# Patient Record
Sex: Male | Born: 1990 | Race: White | Hispanic: No | Marital: Married | Smoking: Former smoker
Health system: Southern US, Community
[De-identification: ages and names within clinical notes are randomized; demographics above are authoritative.]

---
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
## 2011-07-23 IMAGING — US ABDOMEN ULTRASOUND
1 series · 17 of 25 positions shown · non-contrast
Comparison: none

REASON FOR EXAM: Upper abd pain with elevated AST and bilirubin
COMMENTS:

[Series 1: abdomen ultrasound · 17 of 54 slices shown]
[im 1/54]
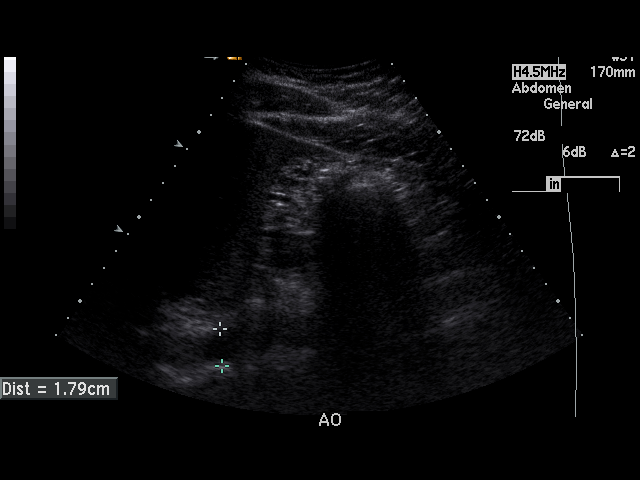
[im 5/54]
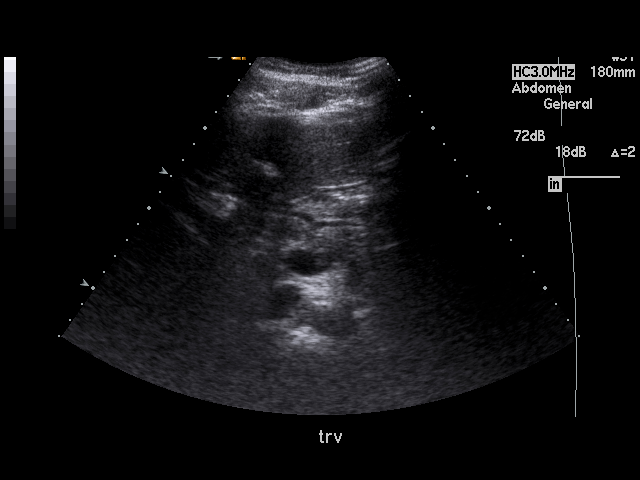
[im 7/54]
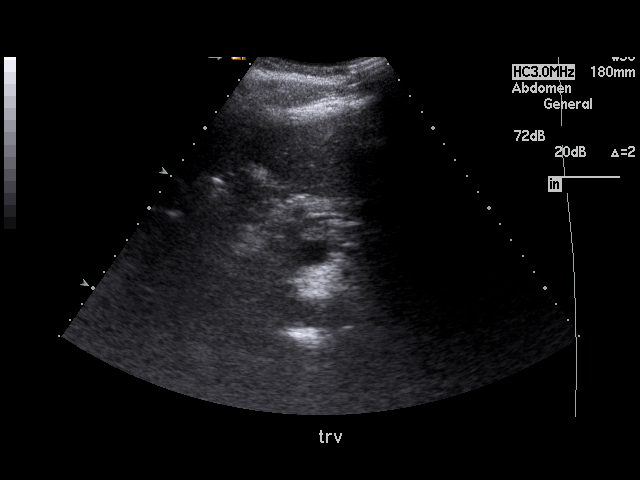
[im 12/54]
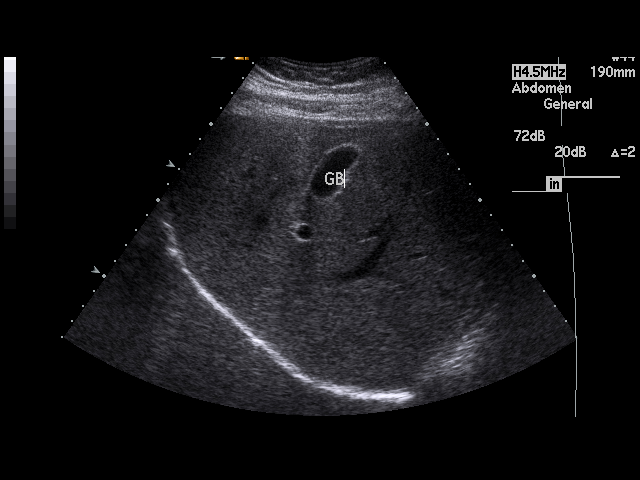
[im 14/54]
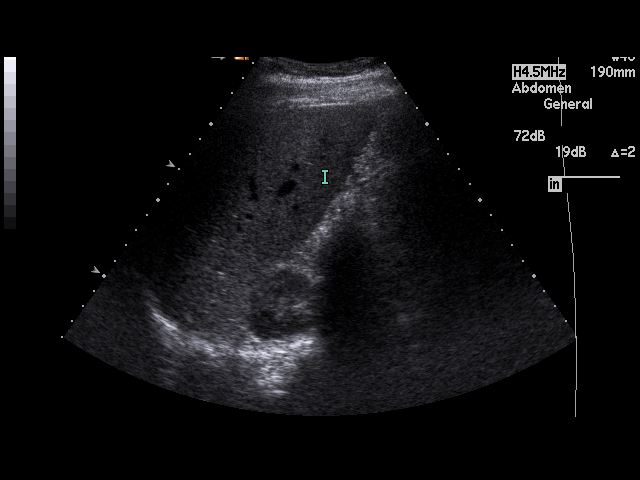
[im 18/54]
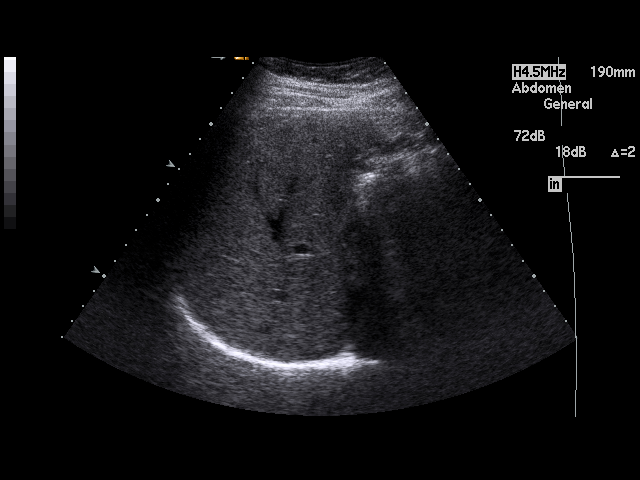
[im 20/54]
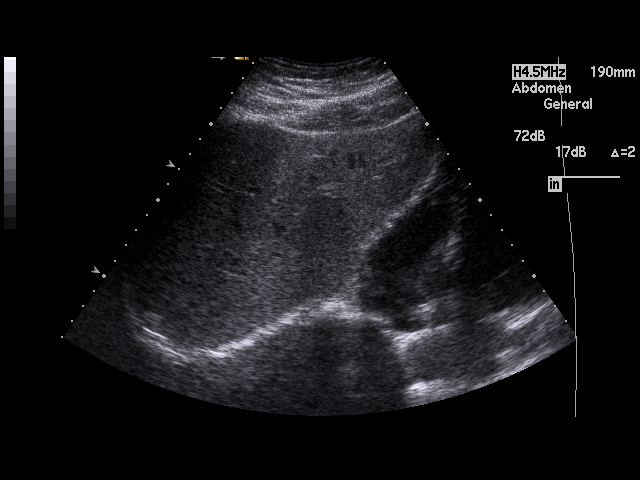
[im 25/54]
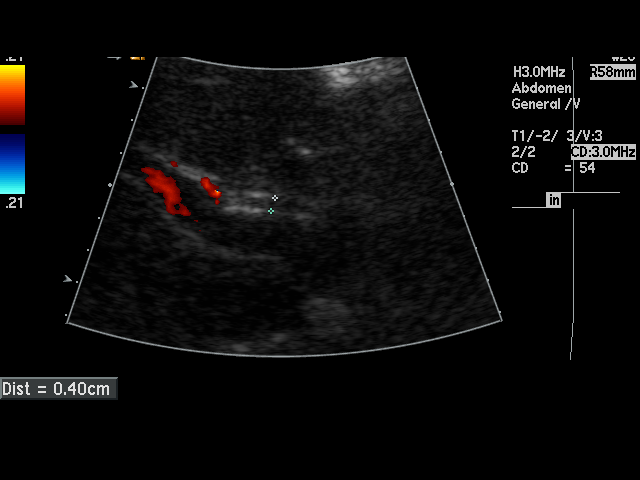
[im 27/54]
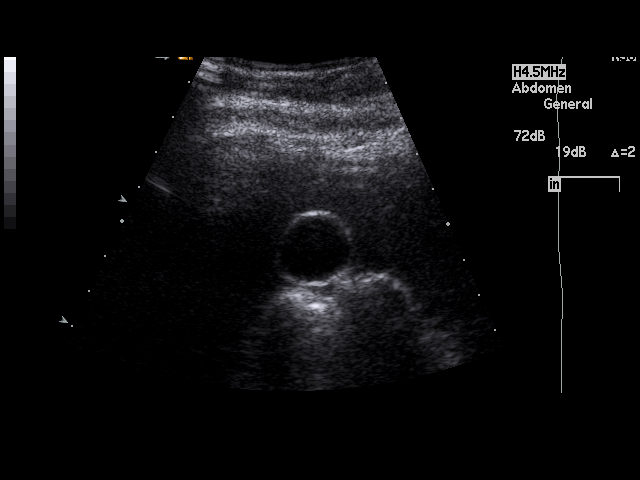
[im 29/54]
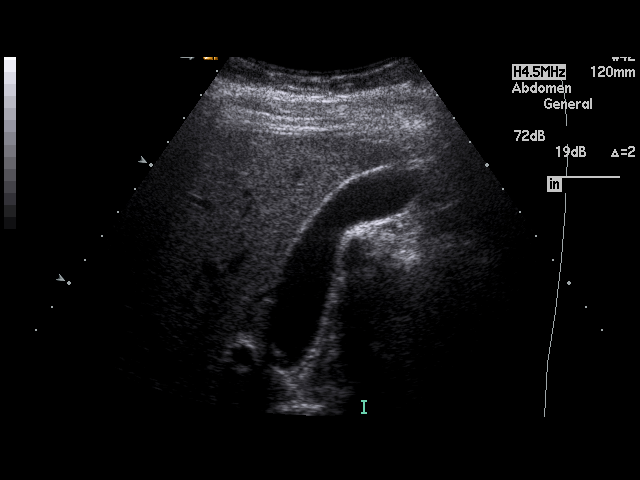
[im 34/54]
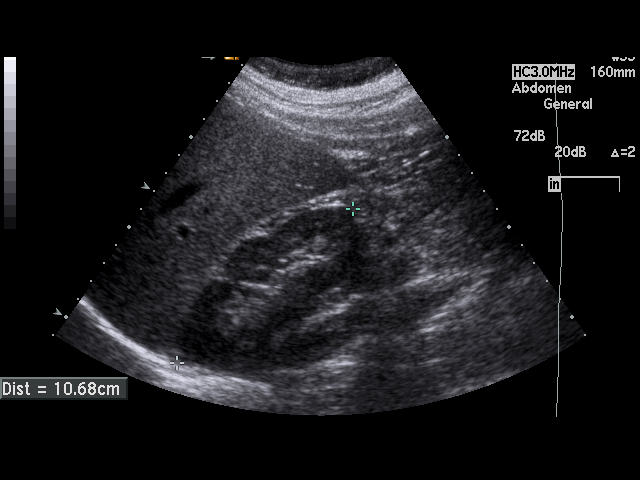
[im 36/54]
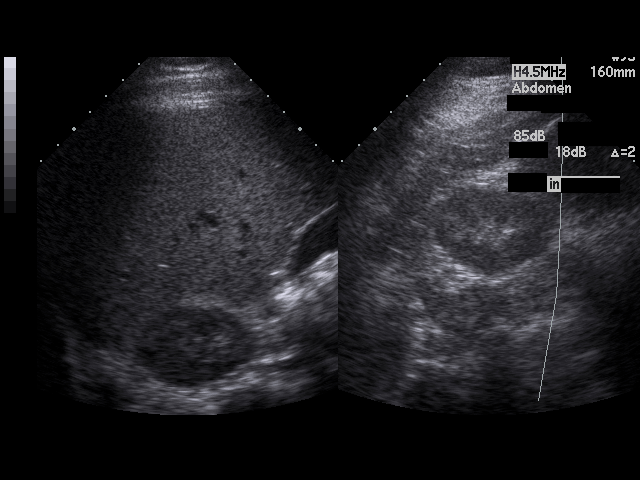
[im 40/54]
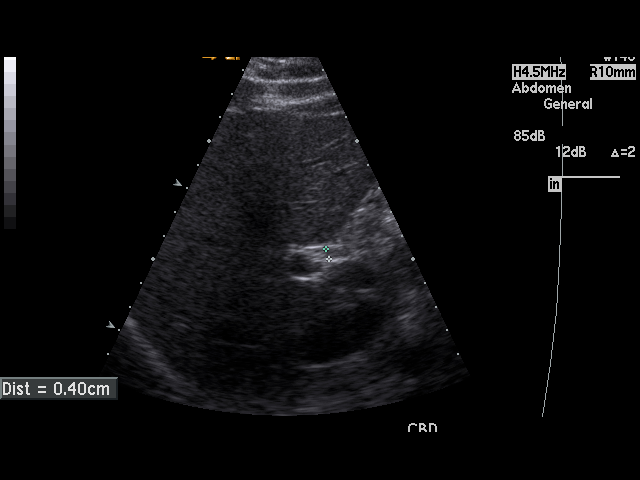
[im 42/54]
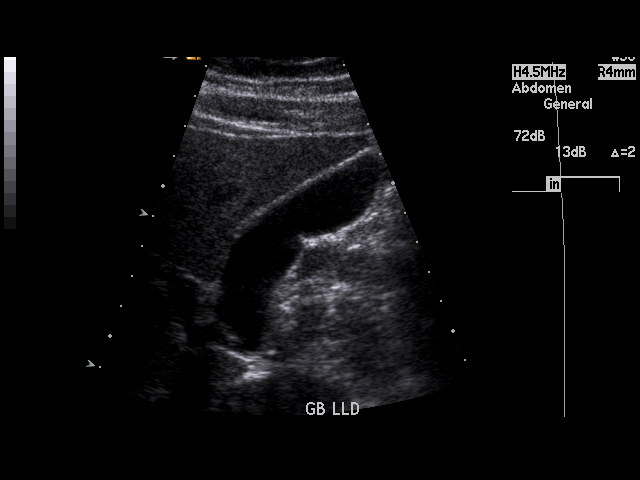
[im 47/54]
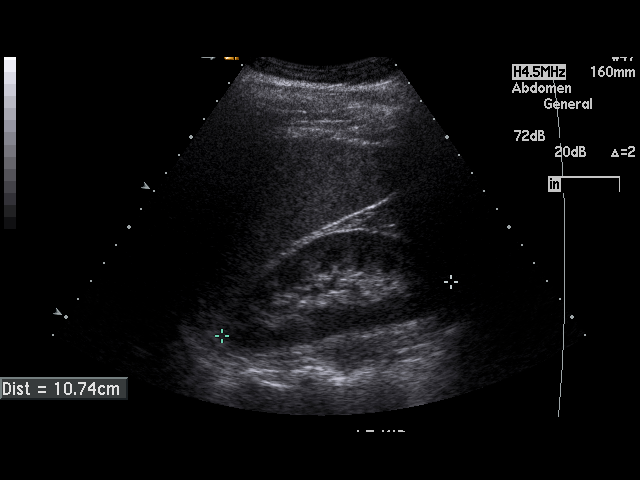
[im 49/54]
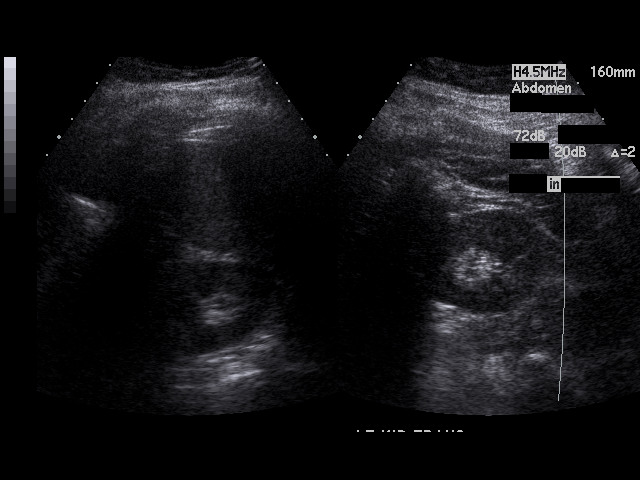
[im 54/54]
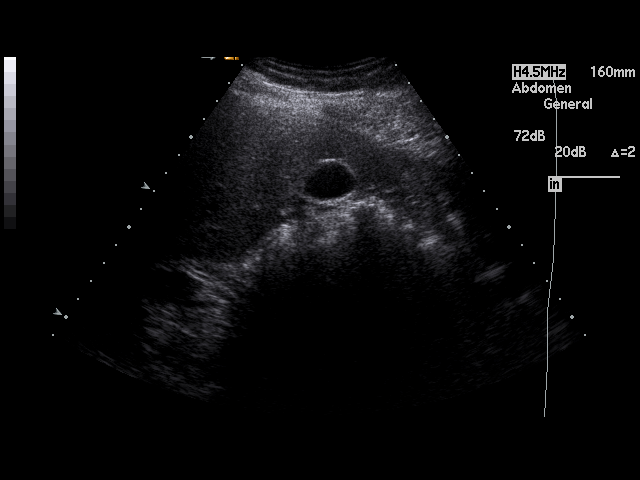

[17 of 25 positions shown; findings below may reference images not displayed]

PROCEDURE:     US  - US ABDOMEN GENERAL SURVEY  - July 25, 2009  [DATE]

RESULT:     The liver, spleen, abdominal aorta and inferior vena cava show
no significant abnormalities. The body of the pancreas is normal in
appearance but the head and tail are partially obscured. No gallstones are
seen. There is no thickening of the gallbladder wall. The common bile duct
measures 4 mm in diameter which is within normal limits. The kidneys show no
hydronephrosis. There is no ascites.
IMPRESSION: 1. No significant abnormalities are identified.
2. No gallstones are seen.
3. The pancreas is partially obscured by bowel gas.

## 2011-08-23 IMAGING — RF DG UGI W/ SMALL BOWEL
2 series · 14 of 22 positions shown · non-contrast
Comparison: none

REASON FOR EXAM: Abn GI Xray
COMMENTS:

[Series 1: run · 11 of 18 slices shown (1 of 2)]
[im 1/18]
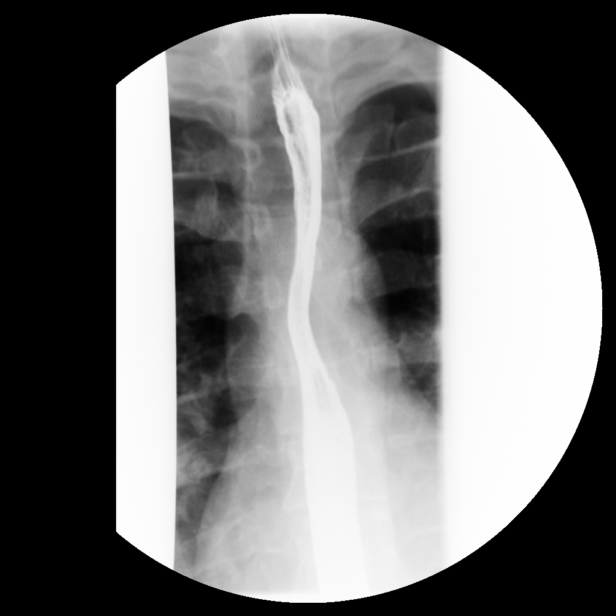
[im 3/18]
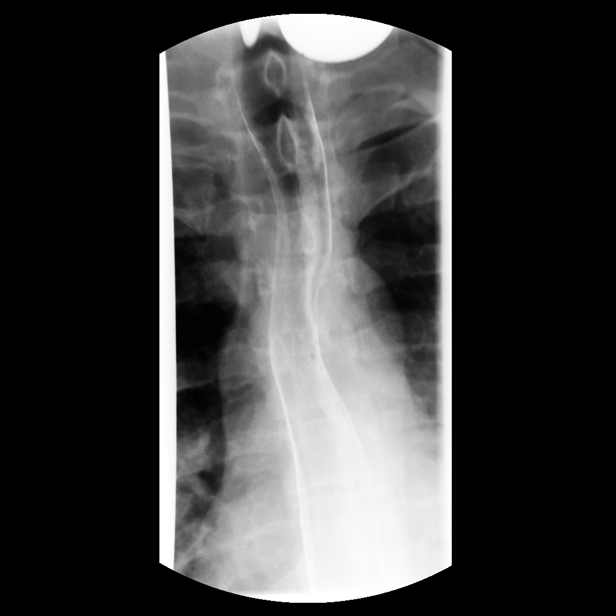
[im 4/18]
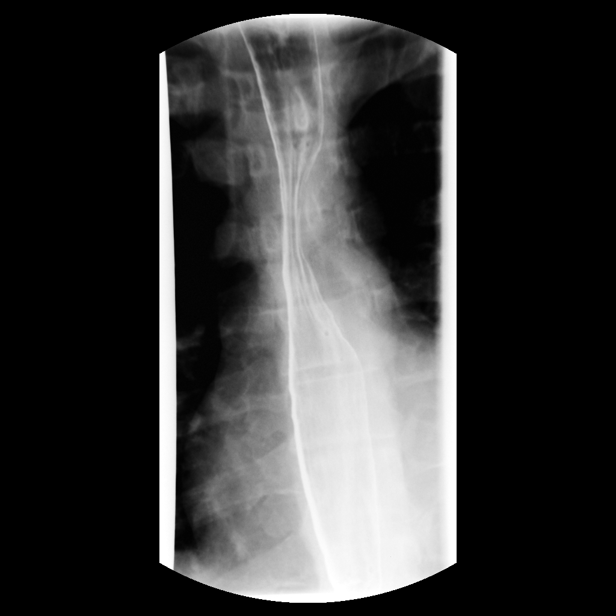
[im 6/18]
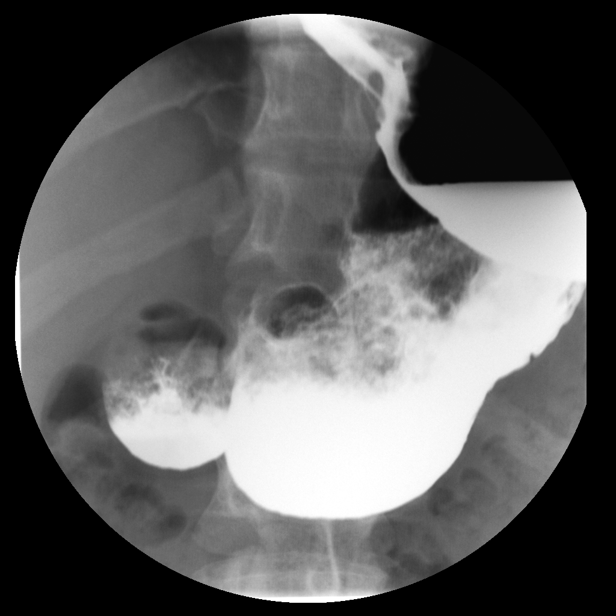
[im 8/18]
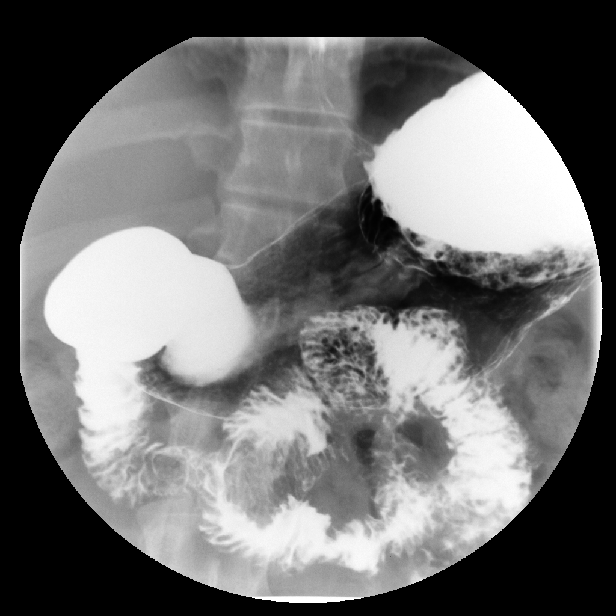
[im 9/18]
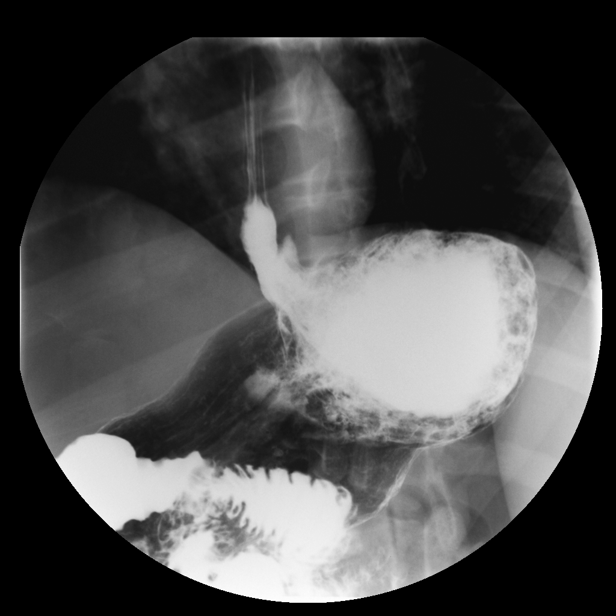
[im 11/18]
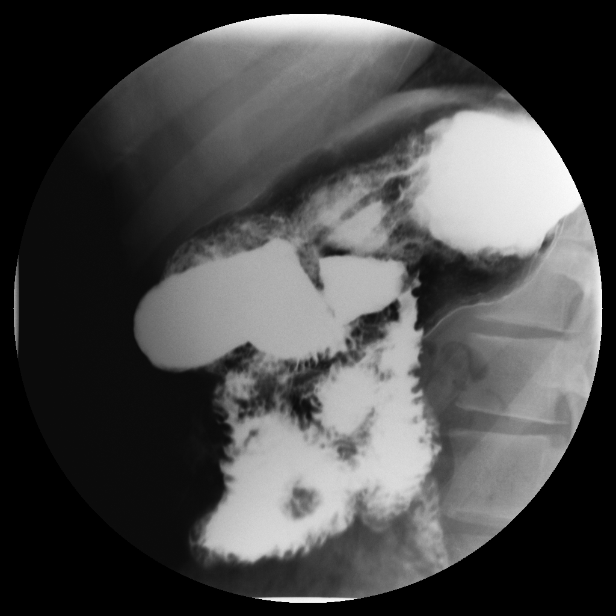
[im 12/18]
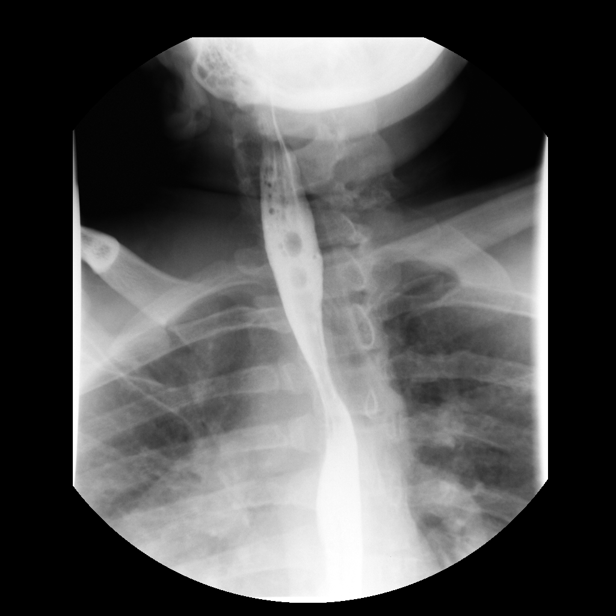
[im 14/18]
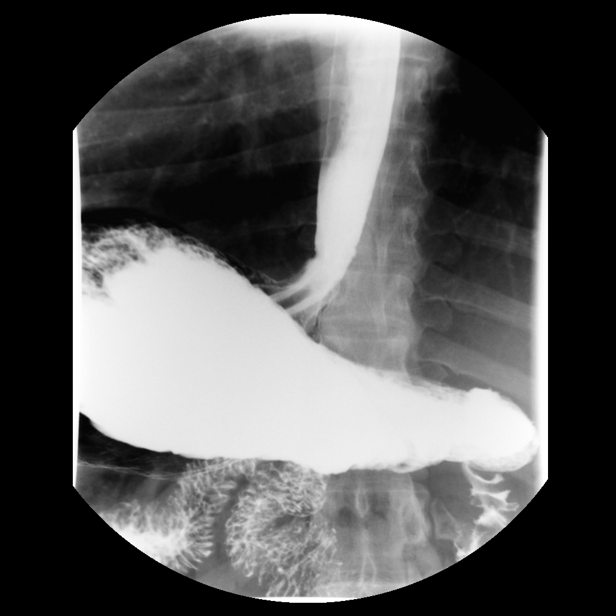
[im 15/18]
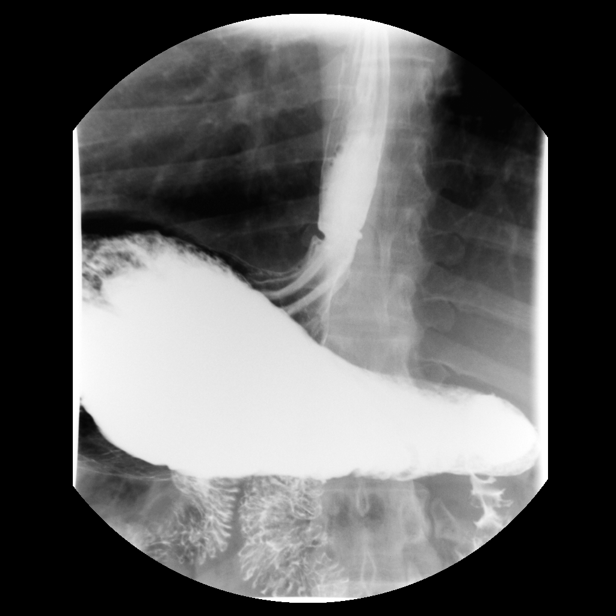
[im 17/18]
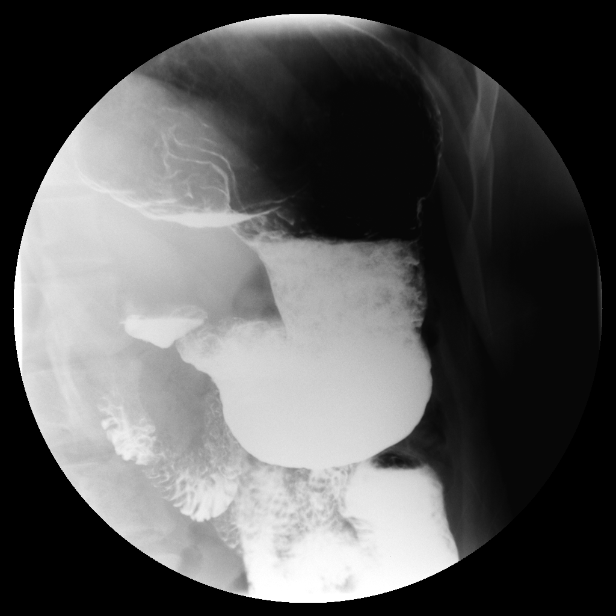

[Series 2: run · 3 of 4 slices shown (2 of 2)]
[im 1/4]
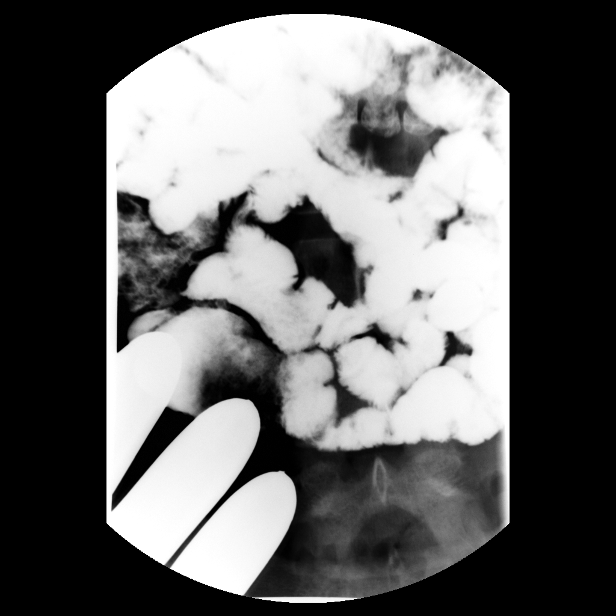
[im 2/4]
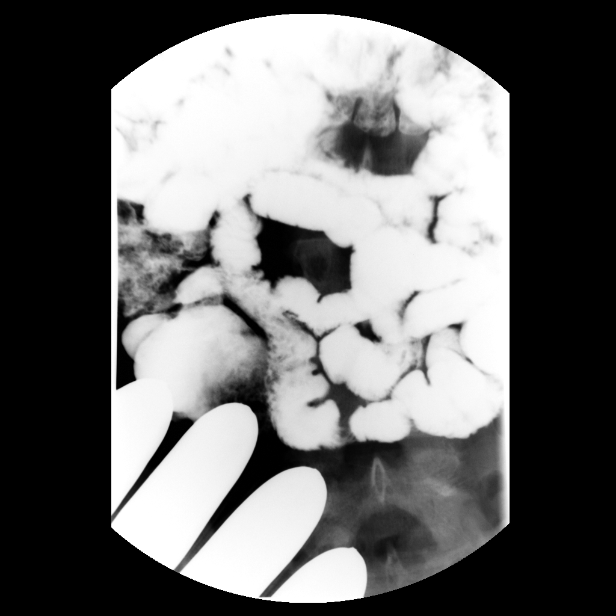
[im 4/4]
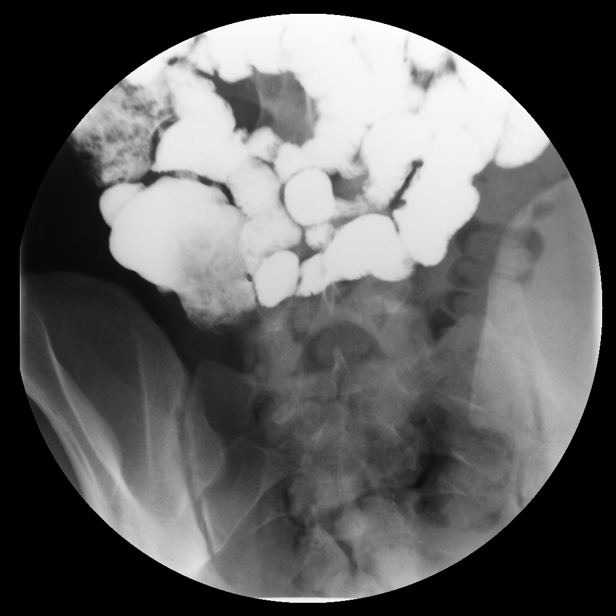

[14 of 22 positions shown; findings below may reference images not displayed]

PROCEDURE:     FL  - FL UPPER GI WITH SMALL BOWEL  - August 25, 2009 [DATE]

RESULT:     Double contrast upper GI with small bowel follow-through was
performed. The patient easily ingested the liquid barium. The esophagus and
stomach distend normally. There appears to be residual gastric content
within the stomach but the patient denied eating since approximately [DATE]
p.m. to [DATE] p.m. of the prior day. Correlate for some possible gastric
retention. There is a normal mucosal pattern in the esophagus and visualized
stomach. There may be a tiny Schatzki ring in the distal esophagus. No
definite herniation is seen. Minimal gastroesophageal reflux was seen only
into the distal portion of the esophagus. A tiny intermittent sliding hiatal
hernia is not excluded. Barium flowed from the stomach into the small bowel
without evidence of significant retention of liquid. Small bowel barium
column reached the colon approximately 30 minutes after initiation of the
exam. The terminal ileum shows a normal appearance. No abnormal mucosal
pattern was evident.
IMPRESSION: Some residual gastric content was present at the start of the exam.
Correlate for gastric retention of solids. Minimal gastroesophageal reflux
was present into the distal esophagus only.

## 2012-12-21 ENCOUNTER — Emergency Department: Payer: Self-pay | Admitting: Emergency Medicine

## 2017-09-20 ENCOUNTER — Ambulatory Visit (INDEPENDENT_AMBULATORY_CARE_PROVIDER_SITE_OTHER): Payer: BC Managed Care – PPO | Admitting: Unknown Physician Specialty

## 2017-09-20 ENCOUNTER — Encounter: Payer: Self-pay | Admitting: Unknown Physician Specialty

## 2017-09-20 VITALS — BP 137/95 | HR 80 | Temp 99.4°F | Ht 69.8 in | Wt 217.9 lb

## 2017-09-20 DIAGNOSIS — Z87438 Personal history of other diseases of male genital organs: Secondary | ICD-10-CM | POA: Insufficient documentation

## 2017-09-20 DIAGNOSIS — D369 Benign neoplasm, unspecified site: Secondary | ICD-10-CM | POA: Diagnosis not present

## 2017-09-20 DIAGNOSIS — Z0000 Encounter for general adult medical examination without abnormal findings: Secondary | ICD-10-CM

## 2017-09-20 LAB — UA/M W/RFLX CULTURE, ROUTINE
Bilirubin, UA: NEGATIVE
GLUCOSE, UA: NEGATIVE
Ketones, UA: NEGATIVE
LEUKOCYTES UA: NEGATIVE
Nitrite, UA: NEGATIVE
PH UA: 6 (ref 5.0–7.5)
PROTEIN UA: NEGATIVE
RBC UA: NEGATIVE
SPEC GRAV UA: 1.01 (ref 1.005–1.030)
Urobilinogen, Ur: 0.2 mg/dL (ref 0.2–1.0)

## 2017-09-20 NOTE — Progress Notes (Signed)
BP (!) 137/95 (BP Location: Left Arm, Cuff Size: Normal)   Pulse 80   Temp 99.4 F (37.4 C) (Oral)   Ht 5' 9.8" (1.773 m)   Wt 217 lb 14.4 oz (98.8 kg)   SpO2 99%   BMI 31.44 kg/m    Subjective:    Patient ID: Jesse Mayo, male    DOB: May 25, 1991, 27 y.o.   MRN: [id number]  HPI: Jesse Mayo is a 27 y.o. male  Chief Complaint  Patient presents with  . Establish Care   Prostatitis Treated for prostatitis in February.  Felt he had similar symptoms 6 weeks ago.  He was feeling "uncomfortable."  This went away and changed laundry detergent.  Has been exclusive with his his fiance for a number of years.   Social History   Socioeconomic History  . Marital status: Single    Spouse name: Not on file  . Number of children: Not on file  . Years of education: Not on file  . Highest education level: Not on file  Occupational History  . Not on file  Social Needs  . Financial resource strain: Not on file  . Food insecurity:    Worry: Not on file    Inability: Not on file  . Transportation needs:    Medical: Not on file    Non-medical: Not on file  Tobacco Use  . Smoking status: Former Smoker    Last attempt to quit: 06/05/2011    Years since quitting: 6.2  . Smokeless tobacco: Former Network engineer and Sexual Activity  . Alcohol use: Yes    Comment: on occasion  . Drug use: Never  . Sexual activity: Yes  Lifestyle  . Physical activity:    Days per week: Not on file    Minutes per session: Not on file  . Stress: Not on file  Relationships  . Social connections:    Talks on phone: Not on file    Gets together: Not on file    Attends religious service: Not on file    Active member of club or organization: Not on file    Attends meetings of clubs or organizations: Not on file    Relationship status: Not on file  . Intimate partner violence:    Fear of current or ex partner: Not on file    Emotionally abused: Not on file    Physically abused: Not on file   Forced sexual activity: Not on file  Other Topics Concern  . Not on file  Social History Narrative  . Not on file   Family History  Problem Relation Age of Onset  . Diabetes Father   . Hypertension Father   . Dementia Paternal Grandfather    History reviewed. No pertinent past medical history.  History reviewed. No pertinent surgical history.  Relevant past medical, surgical, family and social history reviewed and updated as indicated. Interim medical history since our last visit reviewed. Allergies and medications reviewed and updated.  Review of Systems  Constitutional: Negative.   HENT: Negative.   Eyes: Negative.   Respiratory: Negative.   Cardiovascular: Negative.   Gastrointestinal: Negative.   Endocrine: Negative.   Genitourinary: Negative.   Skin: Negative.   Allergic/Immunologic: Negative.   Neurological: Negative.   Hematological: Negative.   Psychiatric/Behavioral: Negative.     Per HPI unless specifically indicated above     Objective:    BP (!) 137/95 (BP Location: Left Arm, Cuff Size: Normal)  Pulse 80   Temp 99.4 F (37.4 C) (Oral)   Ht 5' 9.8" (1.773 m)   Wt 217 lb 14.4 oz (98.8 kg)   SpO2 99%   BMI 31.44 kg/m   Wt Readings from Last 3 Encounters:  09/20/17 217 lb 14.4 oz (98.8 kg)    Physical Exam  Constitutional: He is oriented to person, place, and time. He appears well-developed and well-nourished.  HENT:  Head: Normocephalic.  Right Ear: Tympanic membrane, external ear and ear canal normal.  Left Ear: Tympanic membrane, external ear and ear canal normal.  Mouth/Throat: Uvula is midline, oropharynx is clear and moist and mucous membranes are normal.  Eyes: Pupils are equal, round, and reactive to light.  Cardiovascular: Normal rate, regular rhythm and normal heart sounds. Exam reveals no gallop and no friction rub.  No murmur heard. Pulmonary/Chest: Effort normal and breath sounds normal. No respiratory distress.  Abdominal: Soft.  Bowel sounds are normal. He exhibits no distension. There is no tenderness.  Musculoskeletal: Normal range of motion.  Neurological: He is alert and oriented to person, place, and time. He has normal reflexes.  Skin: Skin is warm and dry.  Psychiatric: He has a normal mood and affect. His behavior is normal. Judgment and thought content normal.    No results found for this or any previous visit.    Assessment & Plan:   Problem List Items Addressed This Visit      Unprioritized   Adenomatous polyp    Refer to GI as last colonoscopy 2015      Relevant Orders   Ambulatory referral to Gastroenterology   History of prostatitis    History of prastate problems x1.  Pt education. Consider referral to Urology further problems       Other Visit Diagnoses    Annual physical exam    -  Primary   Relevant Orders   CBC with Differential/Platelet   Comprehensive metabolic panel   Lipid Panel w/o Chol/HDL Ratio   TSH   UA/M w/rflx Culture, Routine   HIV antibody    +   Follow up plan: Return if symptoms worsen or fail to improve.

## 2017-09-20 NOTE — Assessment & Plan Note (Signed)
Refer to GI as last colonoscopy 2015

## 2017-09-20 NOTE — Assessment & Plan Note (Signed)
History of prastate problems x1.  Pt education. Consider referral to Urology further problems

## 2017-09-21 LAB — CBC WITH DIFFERENTIAL/PLATELET
BASOS ABS: 0 10*3/uL (ref 0.0–0.2)
Basos: 1 %
EOS (ABSOLUTE): 0.3 10*3/uL (ref 0.0–0.4)
EOS: 6 %
HEMATOCRIT: 46.1 % (ref 37.5–51.0)
Hemoglobin: 15.7 g/dL (ref 13.0–17.7)
IMMATURE GRANULOCYTES: 0 %
Immature Grans (Abs): 0 10*3/uL (ref 0.0–0.1)
Lymphocytes Absolute: 1.6 10*3/uL (ref 0.7–3.1)
Lymphs: 33 %
MCH: 31.4 pg (ref 26.6–33.0)
MCHC: 34.1 g/dL (ref 31.5–35.7)
MCV: 92 fL (ref 79–97)
MONOS ABS: 0.5 10*3/uL (ref 0.1–0.9)
Monocytes: 10 %
NEUTROS PCT: 50 %
Neutrophils Absolute: 2.4 10*3/uL (ref 1.4–7.0)
PLATELETS: 255 10*3/uL (ref 150–379)
RBC: 5 x10E6/uL (ref 4.14–5.80)
RDW: 14.1 % (ref 12.3–15.4)
WBC: 4.7 10*3/uL (ref 3.4–10.8)

## 2017-09-21 LAB — COMPREHENSIVE METABOLIC PANEL WITH GFR
ALT: 57 IU/L — ABNORMAL HIGH (ref 0–44)
AST: 30 IU/L (ref 0–40)
Albumin/Globulin Ratio: 2 (ref 1.2–2.2)
Albumin: 4.9 g/dL (ref 3.5–5.5)
Alkaline Phosphatase: 84 IU/L (ref 39–117)
BUN/Creatinine Ratio: 13 (ref 9–20)
BUN: 12 mg/dL (ref 6–20)
Bilirubin Total: 1.3 mg/dL — ABNORMAL HIGH (ref 0.0–1.2)
CO2: 25 mmol/L (ref 20–29)
Calcium: 10.4 mg/dL — ABNORMAL HIGH (ref 8.7–10.2)
Chloride: 99 mmol/L (ref 96–106)
Creatinine, Ser: 0.94 mg/dL (ref 0.76–1.27)
GFR calc Af Amer: 129 mL/min/1.73
GFR calc non Af Amer: 111 mL/min/1.73
Globulin, Total: 2.4 g/dL (ref 1.5–4.5)
Glucose: 88 mg/dL (ref 65–99)
Potassium: 4.4 mmol/L (ref 3.5–5.2)
Sodium: 140 mmol/L (ref 134–144)
Total Protein: 7.3 g/dL (ref 6.0–8.5)

## 2017-09-21 LAB — LIPID PANEL W/O CHOL/HDL RATIO
Cholesterol, Total: 201 mg/dL — ABNORMAL HIGH (ref 100–199)
HDL: 34 mg/dL — ABNORMAL LOW
LDL Calculated: 135 mg/dL — ABNORMAL HIGH (ref 0–99)
Triglycerides: 159 mg/dL — ABNORMAL HIGH (ref 0–149)
VLDL Cholesterol Cal: 32 mg/dL (ref 5–40)

## 2017-09-21 LAB — TSH: TSH: 1.98 u[IU]/mL (ref 0.450–4.500)

## 2017-09-21 LAB — HIV ANTIBODY (ROUTINE TESTING W REFLEX): HIV Screen 4th Generation wRfx: NONREACTIVE

## 2017-09-24 ENCOUNTER — Encounter: Payer: Self-pay | Admitting: Unknown Physician Specialty

## 2018-02-04 ENCOUNTER — Other Ambulatory Visit: Payer: Self-pay | Admitting: Nurse Practitioner

## 2018-02-04 DIAGNOSIS — R740 Nonspecific elevation of levels of transaminase and lactic acid dehydrogenase [LDH]: Principal | ICD-10-CM

## 2018-02-04 DIAGNOSIS — R7401 Elevation of levels of liver transaminase levels: Secondary | ICD-10-CM

## 2018-02-14 ENCOUNTER — Ambulatory Visit
Admission: RE | Admit: 2018-02-14 | Discharge: 2018-02-14 | Disposition: A | Discharge status: Home / Self Care | Payer: BC Managed Care – PPO | Source: Ambulatory Visit | Attending: Nurse Practitioner | Admitting: Nurse Practitioner

## 2018-02-14 DIAGNOSIS — R740 Nonspecific elevation of levels of transaminase and lactic acid dehydrogenase [LDH]: Secondary | ICD-10-CM | POA: Insufficient documentation

## 2018-02-14 DIAGNOSIS — K760 Fatty (change of) liver, not elsewhere classified: Secondary | ICD-10-CM | POA: Insufficient documentation

## 2018-02-14 DIAGNOSIS — R7401 Elevation of levels of liver transaminase levels: Secondary | ICD-10-CM

## 2020-02-12 IMAGING — US US ABDOMEN COMPLETE
1 series · 13 of 25 positions shown · non-contrast
Comparison: CT abdomen and pelvis August 22, 2009

CLINICAL DATA: Elevated liver enzymes

EXAM:
ABDOMEN ULTRASOUND COMPLETE

[Series 1: us abdomen complete · 13 of 147 slices shown]
[im 1/147]
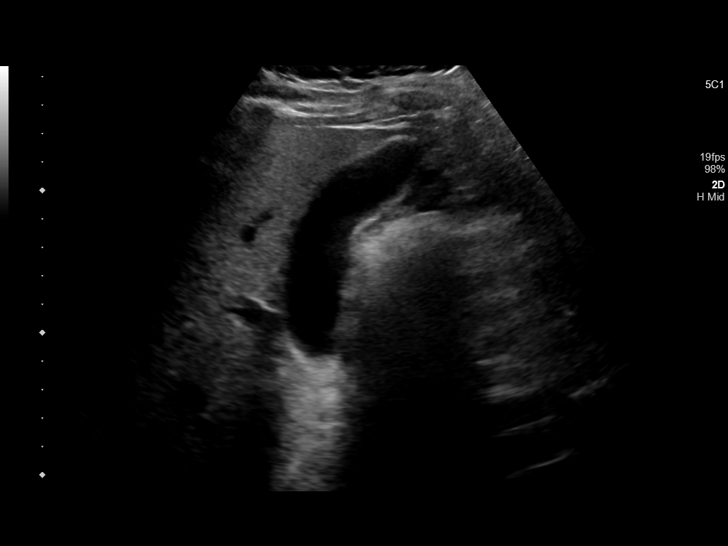
[im 13/147]
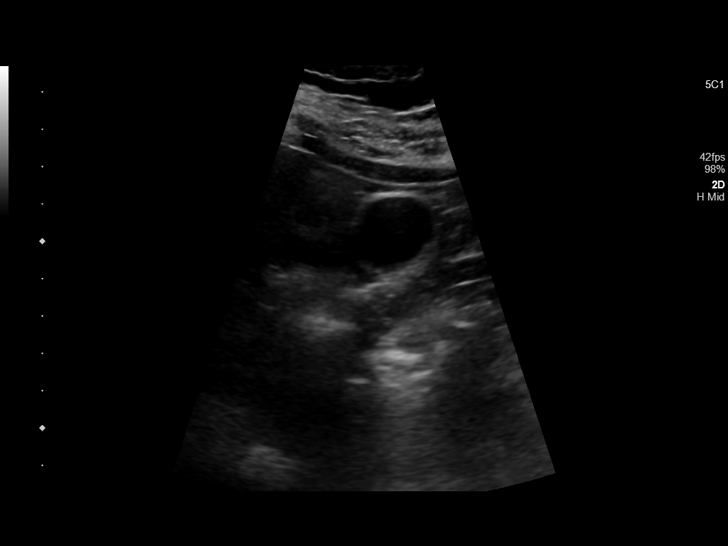
[im 25/147]
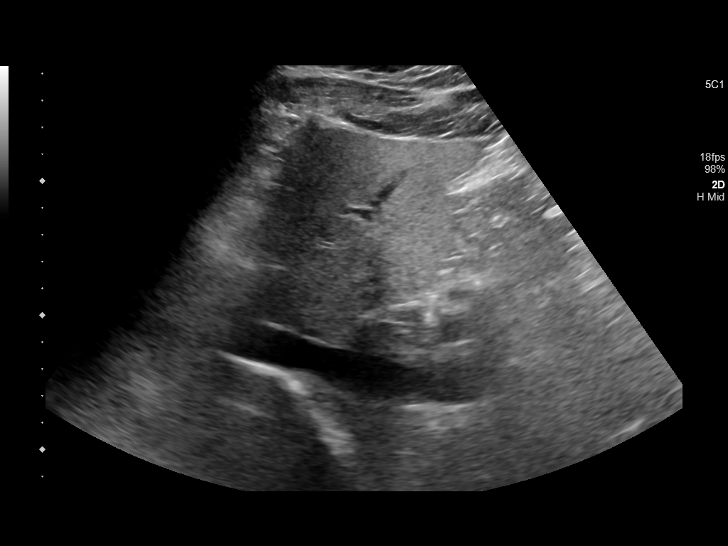
[im 37/147]
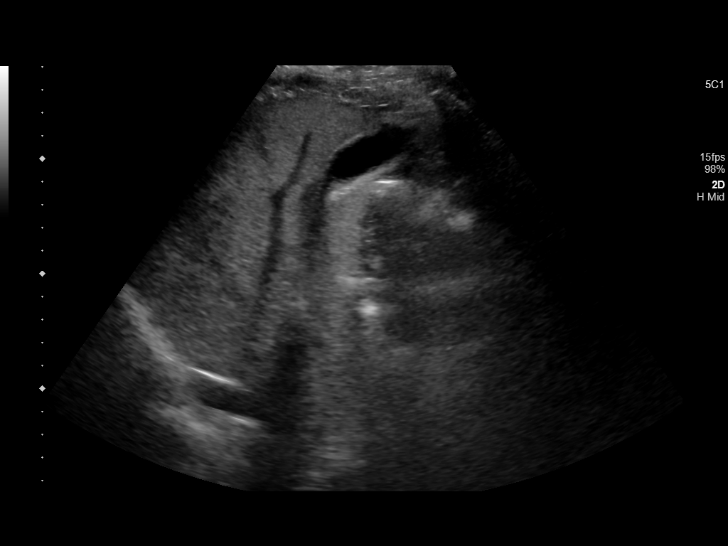
[im 49/147]
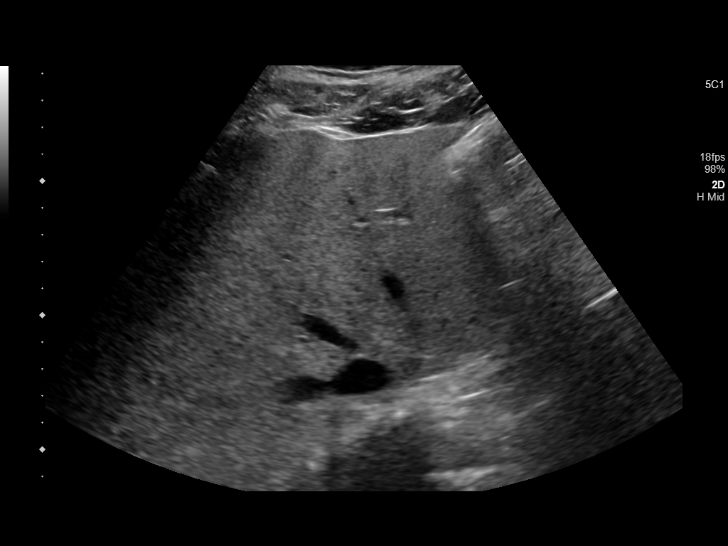
[im 61/147]
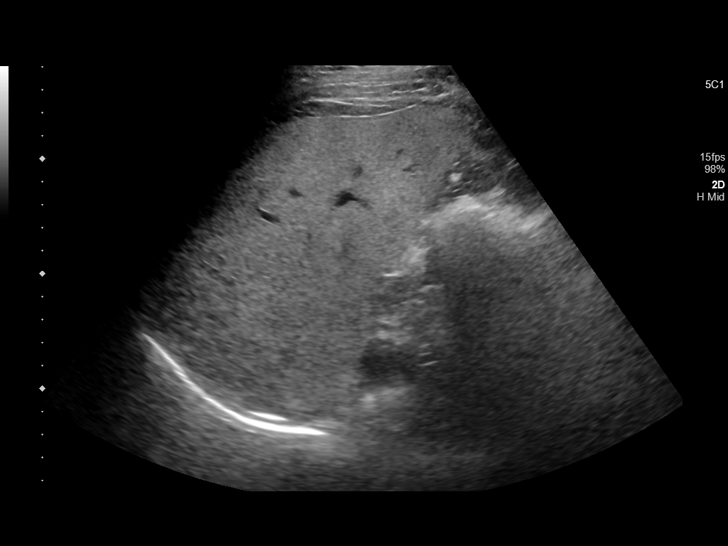
[im 74/147]
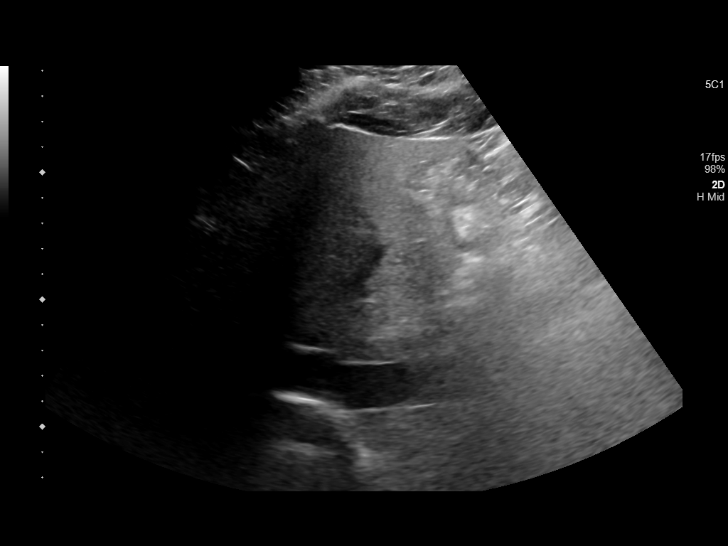
[im 86/147]
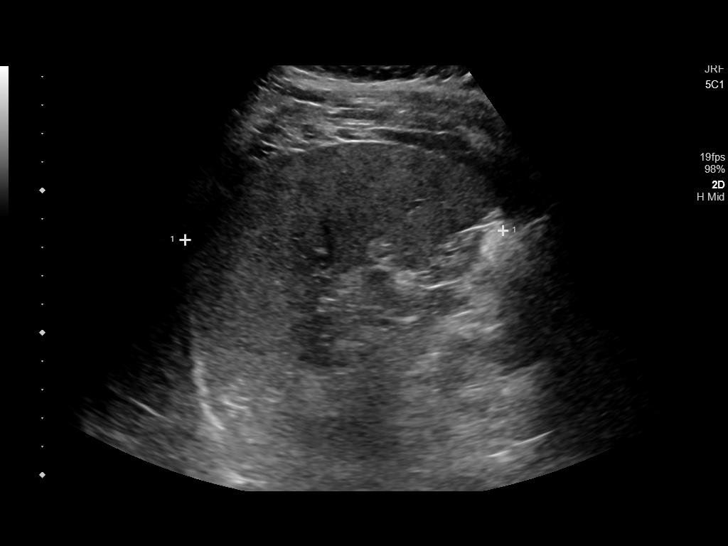
[im 98/147]
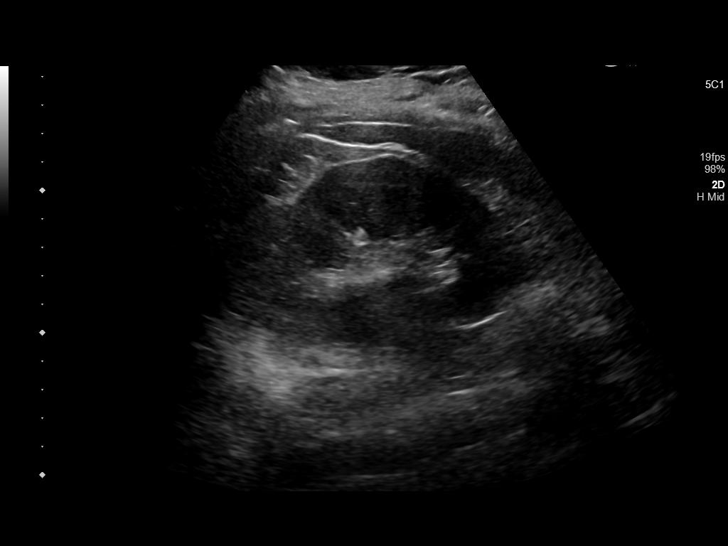
[im 110/147]
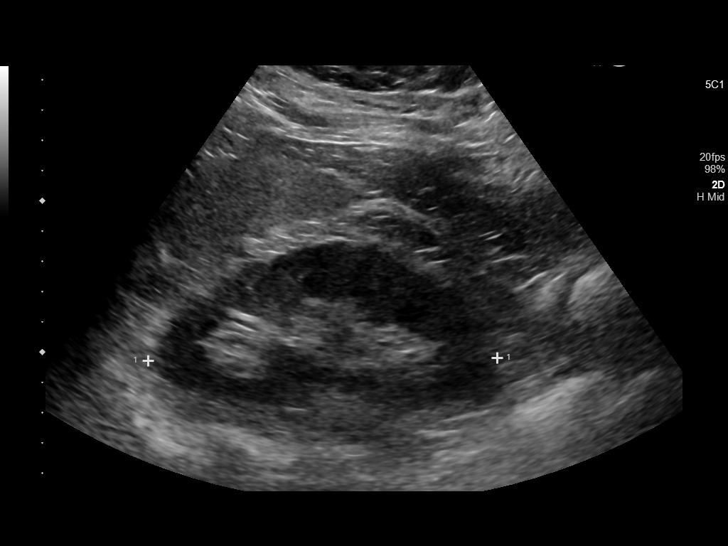
[im 122/147]
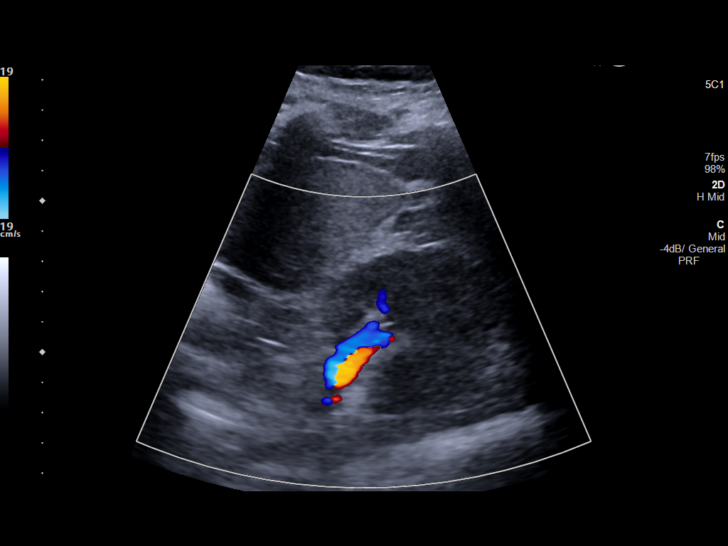
[im 134/147]
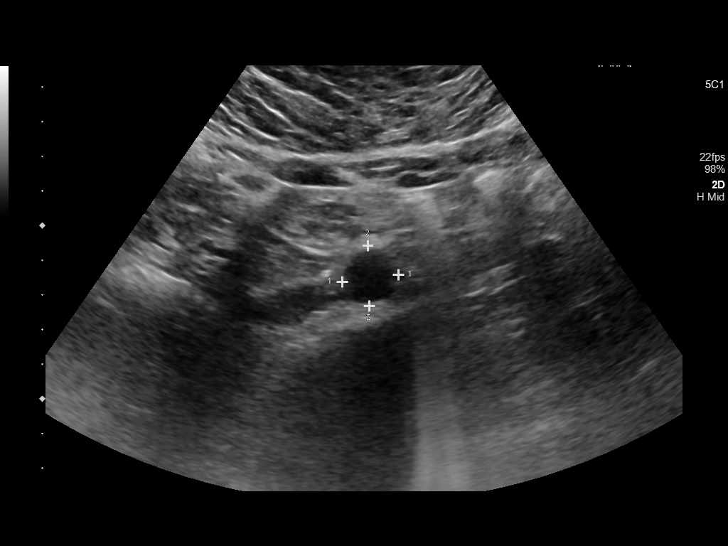
[im 147/147]
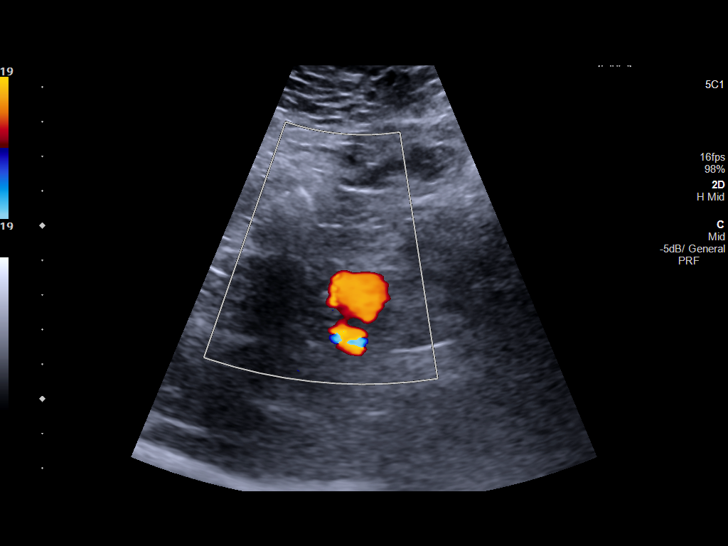

[13 of 25 positions shown; findings below may reference images not displayed]

FINDINGS: Gallbladder: No gallstones or wall thickening visualized. There is
no pericholecystic fluid. No sonographic Murphy sign noted by
sonographer.

Common bile duct: Diameter: 2 mm. No intrahepatic, common hepatic,
or common bile duct dilatation.

Liver: No focal lesion identified. Liver echogenicity overall is
increased. Portal vein is patent on color Doppler imaging with
normal direction of blood flow towards the liver.

IVC: No abnormality visualized.

Pancreas: Visualized portion unremarkable. Portions of pancreas are
obscured by gas.

Spleen: Size and appearance within normal limits.

Right Kidney: Length: 11.2 cm. Echogenicity within normal limits. No
mass or hydronephrosis visualized. There is an apparent small
extrarenal pelvis on the right, an anatomic variant.

Left Kidney: Length: 11.8 cm. Echogenicity within normal limits. No
mass or hydronephrosis visualized.

Abdominal aorta: No aneurysm visualized.

Other findings: No demonstrable ascites.
IMPRESSION: 1. Increase in liver echogenicity, a finding indicative of a degree
of hepatic steatosis. While no focal liver lesions are evident on
this study, it must be cautioned that the sensitivity of ultrasound
for detection of focal liver lesions is diminished in this
circumstance.

2. Portions of pancreas obscured by gas. Visualized portions of
pancreas appear normal.

3.  Study otherwise unremarkable.

## 2022-09-03 ENCOUNTER — Encounter: Payer: Self-pay | Admitting: *Deleted
